# Patient Record
Sex: Female | Born: 1955 | Race: Black or African American | Hispanic: No | Smoking: Never smoker
Health system: Southern US, Community
[De-identification: ages and names within clinical notes are randomized; demographics above are authoritative.]

## PROBLEM LIST (undated history)

## (undated) DIAGNOSIS — I10 Essential (primary) hypertension: Secondary | ICD-10-CM

---

## 2013-04-15 ENCOUNTER — Ambulatory Visit: Payer: Self-pay | Admitting: Family Medicine

## 2014-01-09 ENCOUNTER — Ambulatory Visit: Payer: Self-pay | Admitting: Family Medicine

## 2015-01-26 ENCOUNTER — Ambulatory Visit
Admission: EM | Admit: 2015-01-26 | Discharge: 2015-01-26 | Disposition: A | Discharge status: Home / Self Care | Payer: BLUE CROSS/BLUE SHIELD

## 2015-01-26 ENCOUNTER — Encounter: Payer: Self-pay | Admitting: Emergency Medicine

## 2015-01-26 DIAGNOSIS — L03116 Cellulitis of left lower limb: Secondary | ICD-10-CM

## 2015-01-26 DIAGNOSIS — B029 Zoster without complications: Secondary | ICD-10-CM

## 2015-01-26 HISTORY — DX: Essential (primary) hypertension: I10

## 2015-01-26 NOTE — ED Provider Notes (Signed)
Patient presents today with itchy clustered red bumps on the back of her left calf since Monday. She denies being outdoors. She states that the rash appeared while she was at work seated. She denies any fever or chills. She denies any rash on the right side of her body. She denies any headache, sore throat, weight loss. She does admit to having shingles in the past.  ROS: Negative except mentioned above. VITALS: Per chart  GENERAL: NAD HEENT: no pharyngeal erythema, no exudate, no cervical LAD RESP: CTA B CARD: RRR SKIN: cluster of erythematous vesicular lesions with warm erythematous base on left calf, no fluid or discharge from site, no streaks NEURO: CN II-XII groslly intact   A/P: Skin rash left calf-discussed with patient that rash appears to be suggestive of shingles however patient does describe itchiness instead of pain. It also has a warm base suggestive of possible infection. We will treat with Valtrex and Bactrim DS. Encourage patient to keep the area clean and dry. If symptoms persist or worsen she is to seek medical attention as discussed. Encourage patient to keep the area covered by wearing pants and a dressing if any drainage from the site.   Jolene Provost, MD 01/26/15 [phone]

## 2015-01-26 NOTE — ED Notes (Signed)
Patient c/o itchy red bumps on the back of her left calf since Monday.  Patient denies fevers.

## 2015-01-26 NOTE — ED Notes (Signed)
Patient called and reports she went to [facility] [place] and none of her medications were there. Per chart, it looks like the medications were not sent in. Per [facility], rx called in for Bactrim DS BID for 10 days #20 no refill. Also called in Valtrex 1gram TID for 7 days #21 no refills.

## 2015-02-09 ENCOUNTER — Telehealth: Payer: Self-pay

## 2015-02-09 ENCOUNTER — Encounter: Payer: Self-pay | Admitting: *Deleted

## 2015-02-09 ENCOUNTER — Ambulatory Visit
Admission: EM | Admit: 2015-02-09 | Discharge: 2015-02-09 | Disposition: A | Discharge status: Home / Self Care | Payer: BLUE CROSS/BLUE SHIELD | Attending: Internal Medicine | Admitting: Internal Medicine

## 2015-02-09 DIAGNOSIS — B009 Herpesviral infection, unspecified: Secondary | ICD-10-CM | POA: Diagnosis not present

## 2015-02-09 MED ORDER — VALACYCLOVIR HCL 500 MG PO TABS: 500.0000 mg | ORAL_TABLET | Freq: Two times a day (BID) | ORAL | Status: DC

## 2015-02-09 MED ORDER — VALACYCLOVIR HCL 500 MG PO TABS
500.0000 mg | ORAL_TABLET | Freq: Two times a day (BID) | ORAL | Status: AC
Start: 2015-02-09 — End: 2015-02-12

## 2015-02-09 NOTE — Discharge Instructions (Signed)
Prescription for valtrex sent to the [facility] in [place].  Cold Sore A cold sore (fever blister) is a skin infection caused by the herpes simplex virus (HSV-1). HSV-1 is closely related to the virus that causes genital herpes (HSV-2), but they are not the same even though both viruses can cause oral and genital infections. Cold sores are small, fluid-filled sores inside of the mouth or on the lips, gums, nose, chin, cheeks, or fingers.  The herpes simplex virus can be easily passed (contagious) to other people through close personal contact, such as kissing or sharing personal items. The virus can also spread to other parts of the body, such as the eyes or genitals. Cold sores are contagious until the sores crust over completely. They often heal within 2 weeks.  Once a person is infected, the herpes simplex virus remains permanently in the body. Therefore, there is no cure for cold sores, and they often recur when a person is tired, stressed, sick, or gets too much sun. Additional factors that can cause a recurrence include hormone changes in menstruation or pregnancy, certain drugs, and cold weather.  CAUSES  Cold sores are caused by the herpes simplex virus. The virus is spread from person to person through close contact, such as through kissing, touching the affected area, or sharing personal items such as lip balm, razors, or eating utensils.  SYMPTOMS  The first infection may not cause symptoms. If symptoms develop, the symptoms often go through different stages. Here is how a cold sore develops:   Tingling, itching, or burning is felt 1-2 days before the outbreak.   Fluid-filled blisters appear on the lips, inside the mouth, nose, or on the cheeks.   The blisters start to ooze clear fluid.   The blisters dry up and a yellow crust appears in its place.   The crust falls off.  Symptoms depend on whether it is the initial outbreak or a recurrence. Some other symptoms with the first  outbreak may include:   Fever.   Sore throat.   Headache.   Muscle aches.   Swollen neck glands.  DIAGNOSIS  A diagnosis is often made based on your symptoms and looking at the sores. Sometimes, a sore may be swabbed and then examined in the lab to make a final diagnosis. If the sores are not present, blood tests can find the herpes simplex virus.  TREATMENT  There is no cure for cold sores and no vaccine for the herpes simplex virus. Within 2 weeks, most cold sores go away on their own without treatment. Medicines cannot make the infection go away, but medicine can help relieve some of the pain associated with the sores, can work to stop the virus from multiplying, and can also shorten healing time. Medicine may be in the form of creams, gels, pills, or a shot.  HOME CARE INSTRUCTIONS   Only take over-the-counter or prescription medicines for pain, discomfort, or fever as directed by your caregiver. Do not use aspirin.   Use a cotton-tip swab to apply creams or gels to your sores.   Do not touch the sores or pick the scabs. Wash your hands often. Do not touch your eyes without washing your hands first.   Avoid kissing, oral sex, and sharing personal items until sores heal.   Apply an ice pack on your sores for 10-15 minutes to ease any discomfort.   Avoid hot, cold, or salty foods because they may hurt your mouth. Eat a soft, bland diet  to avoid irritating the sores. Use a straw to drink if you have pain when drinking out of a glass.   Keep sores clean and dry to prevent an infection of other tissues.   Avoid the sun and limit stress if these things trigger outbreaks. If sun causes cold sores, apply sunscreen on the lips before being out in the sun.  SEEK MEDICAL CARE IF:   You have a fever or persistent symptoms for more than 2-3 days.   You have a fever and your symptoms suddenly get worse.   You have pus, not clear fluid, coming from the sores.   You have  redness that is spreading.   You have pain or irritation in your eye.   You get sores on your genitals.   Your sores do not heal within 2 weeks.   You have a weakened immune system.   You have frequent recurrences of cold sores.  MAKE SURE YOU:   Understand these instructions.  Will watch your condition.  Will get help right away if you are not doing well or get worse. Document Released: 07/27/2000 Document Revised: 12/14/2013 Document Reviewed: 12/12/2011 [facility] Patient Information 2015 [place], [place]. This information is not intended to replace advice given to you by your health care provider. Make sure you discuss any questions you have with your health care provider.

## 2015-02-09 NOTE — ED Notes (Signed)
Pt states " Diagnosed on 01/24/15 with shingles, rash was on the back of left leg.  Now the rash has returned again on Monday but is on the left thigh and bottom."

## 2015-02-09 NOTE — ED Notes (Signed)
Pt called stating she is having another shingles outbreak, this time on her left hip. She is wondering is she needs to be seen again.   Informed per Dr. Allena KatzPatel she does need to be seen again. Pt verb she will come over again. Her PCP is on vacation for the next 2 weeks.

## 2015-02-11 NOTE — ED Provider Notes (Signed)
CSN: [id number]     Arrival date & time 02/09/15  1903 History   First MD Initiated Contact with Patient 02/09/15 2021     Chief Complaint  Patient presents with  . Herpes Zoster   HPI Pt states " Diagnosed on 01/24/15 with shingles, rash was on the back of left leg. Now the rash has returned again on Monday (2d ago) but is on the left thigh and bottom." Itchy, more than sore. Had shingles on the same leg a few years ago, also.               Past Medical History  Diagnosis Date  . Hypertension    History reviewed. No pertinent past surgical history. No family history on file. History  Substance Use Topics  . Smoking status: Never Smoker   . Smokeless tobacco: Never Used  . Alcohol Use: Yes   OB History    No data available     Review of Systems  All other systems reviewed and are negative.   Allergies  Review of patient's allergies indicates no known allergies.  Home Medications   Prior to Admission medications   Medication Sig Start Date End Date Taking? Authorizing Provider  hydrochlorothiazide (HYDRODIURIL) 25 MG tablet Take 25 mg by mouth daily.   Yes Historical Provider, MD          BP 123/75 mmHg  Pulse 74  Temp(Src) 98 F (36.7 C) (Oral)  Resp 16  Ht 5\' 2"  (1.575 m)  Wt 178 lb (80.74 kg)  BMI 32.55 kg/m2  SpO2 100% Physical Exam  Constitutional: She is oriented to person, place, and time. No distress.  Alert, nicely groomed  HENT:  Head: Atraumatic.  Eyes:  Conjugate gaze, no eye redness/drainage  Neck: Neck supple.  Cardiovascular: Normal rate.   Pulmonary/Chest: No respiratory distress.  Abdominal: She exhibits no distension.  Musculoskeletal: Normal range of motion.  No leg swelling  Neurological: She is alert and oriented to person, place, and time.  Skin: Skin is warm and dry.  No cyanosis Posterior L thigh/buttock: several discrete red patches, 1.5" across, very minimally indurated, occ tiny vesicles.  Not tender.  No crusting.   Hyperpigmented patch upper L calf/back of knee from prior episode in June, per pt.  Nursing note and vitals reviewed.   ED Course  Procedures  No procedure at the urgent care today.  MDM   1. Herpes simplex infection of skin    Vs zoster.  Multiple episodes suggestive of HSV rather than VZV.  Treatment similar.   Might benefit from suppressive treatment, if continues with frequent outbreaks. Rx valtrex sent to pharmacy.  Pt to followup with pcp/Deborah Sharp-[place], to discuss further.    Eustace MooreLaura W Kelechi Orgeron, MD 02/11/15 [phone]

## 2015-09-12 IMAGING — US US EXTREM LOW VENOUS*L*
1 series · 13 of 24 positions shown · non-contrast
Comparison: None.

CLINICAL DATA: Left leg pain and swelling, evaluate for DVT



[Series 1: us extrem low venous*left* · 0.08mm/px · 13 of 30 slices shown]
[im 1/30]
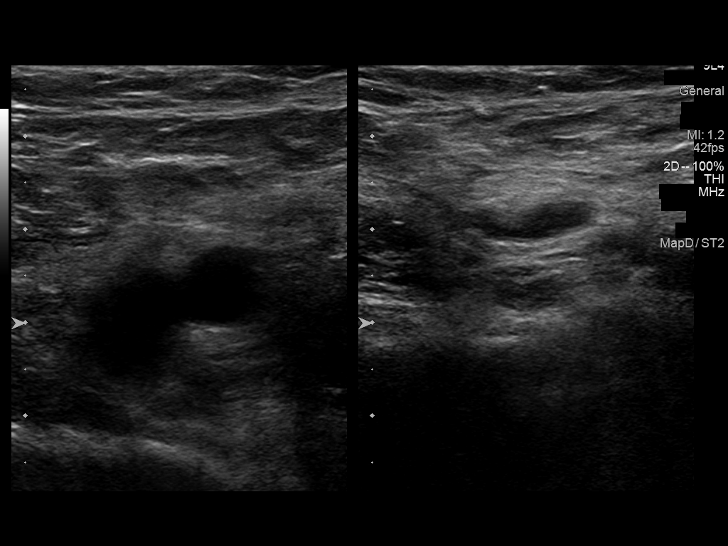
[im 3/30]
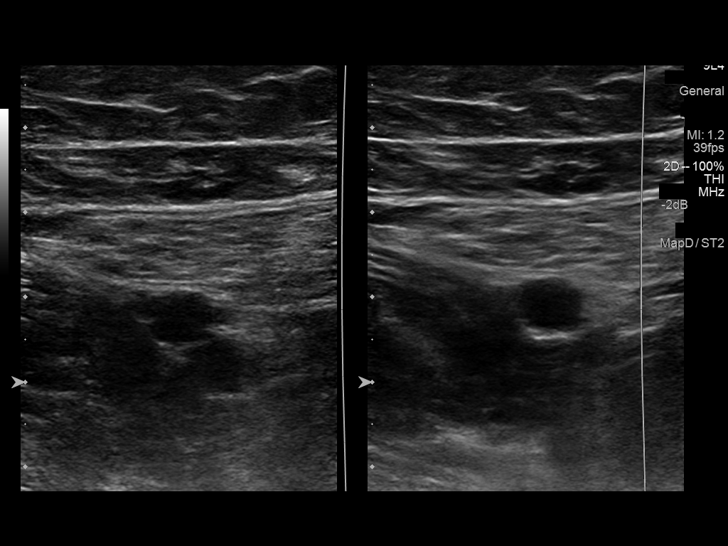
[im 6/30]
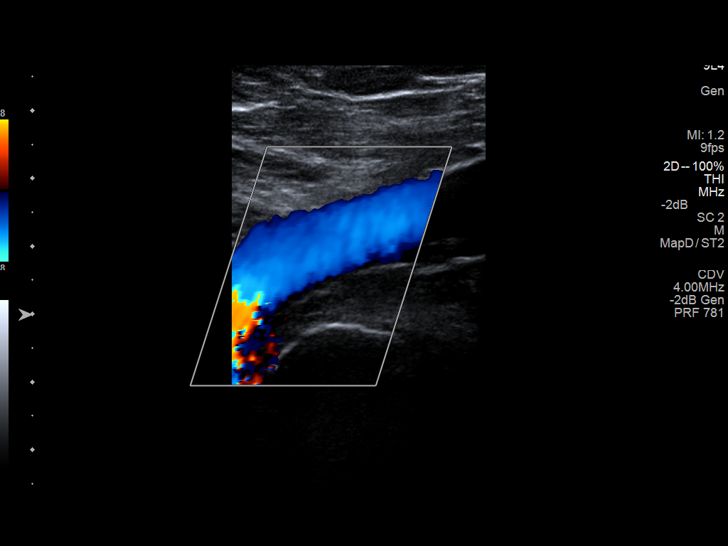
[im 8/30]
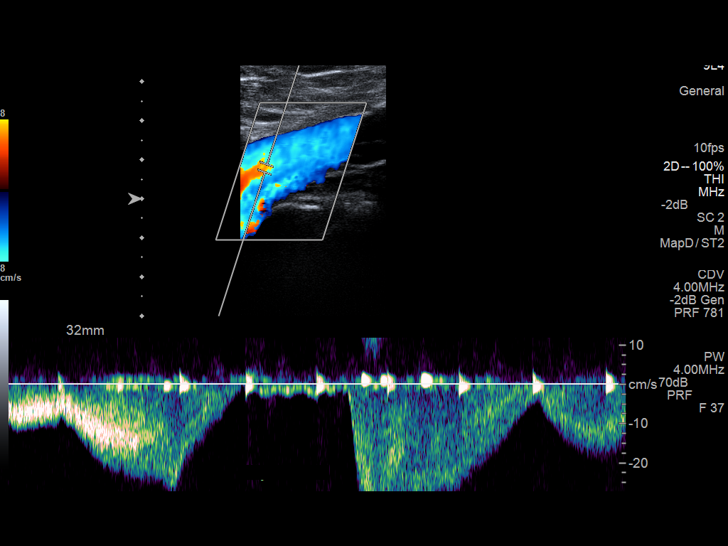
[im 11/30]
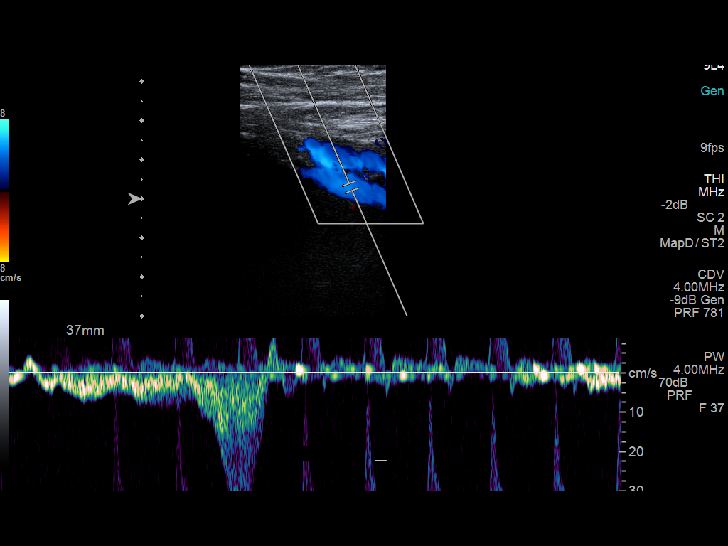
[im 13/30]
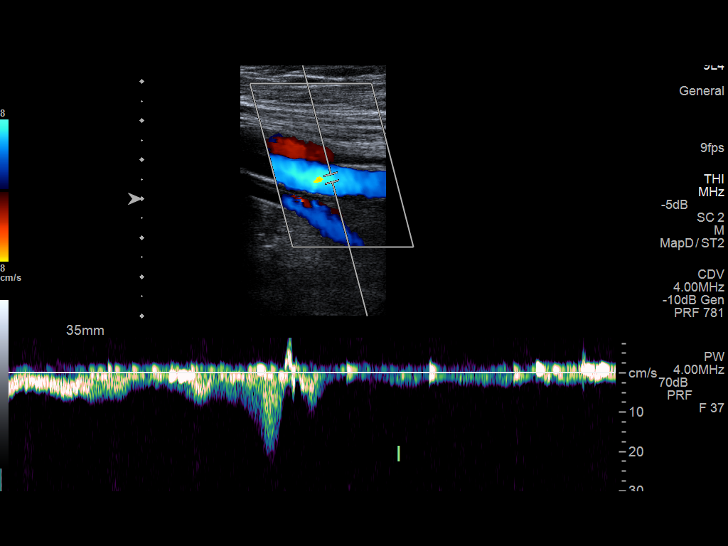
[im 16/30]
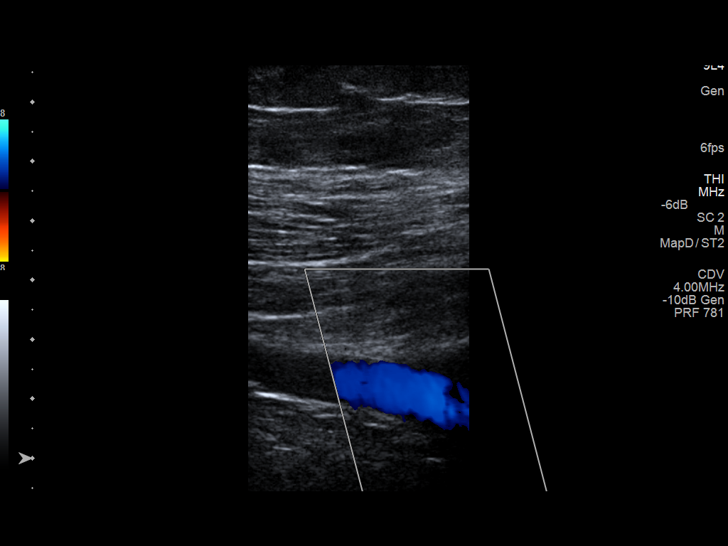
[im 17/30]
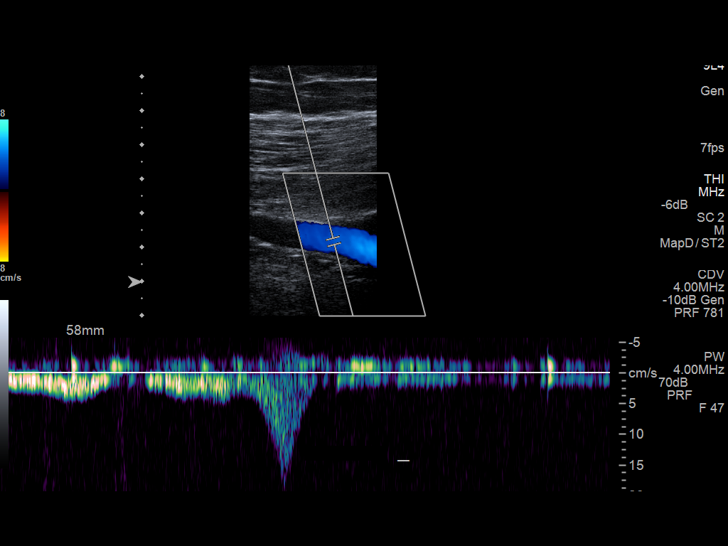
[im 19/30]
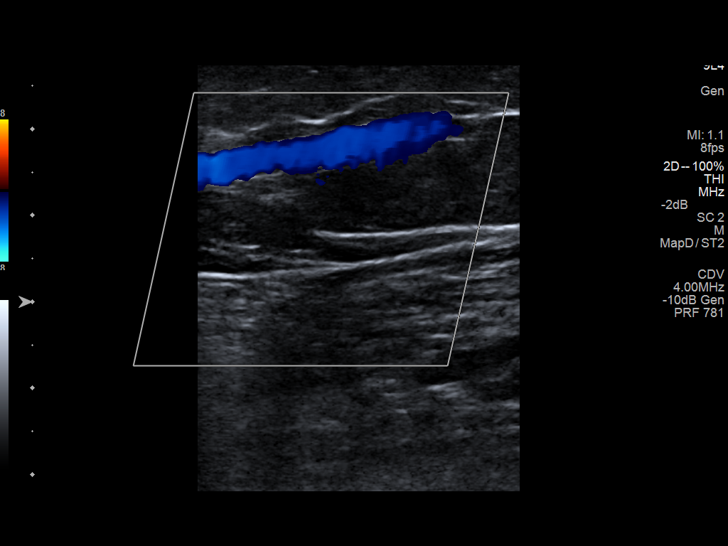
[im 22/30]
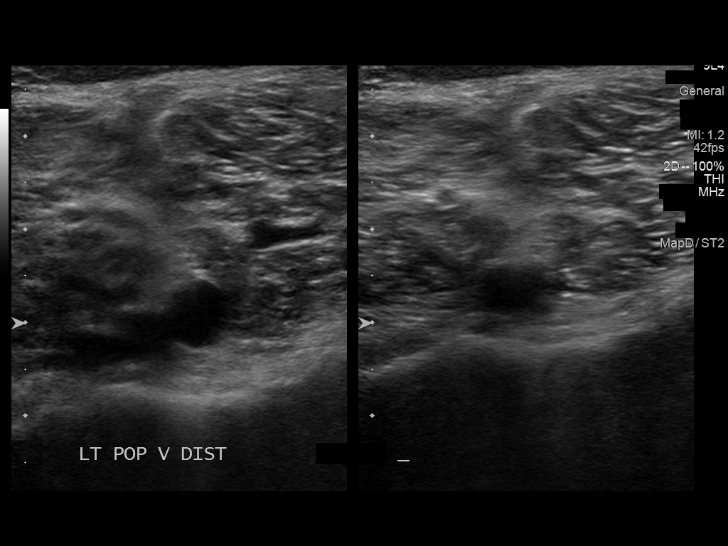
[im 24/30]
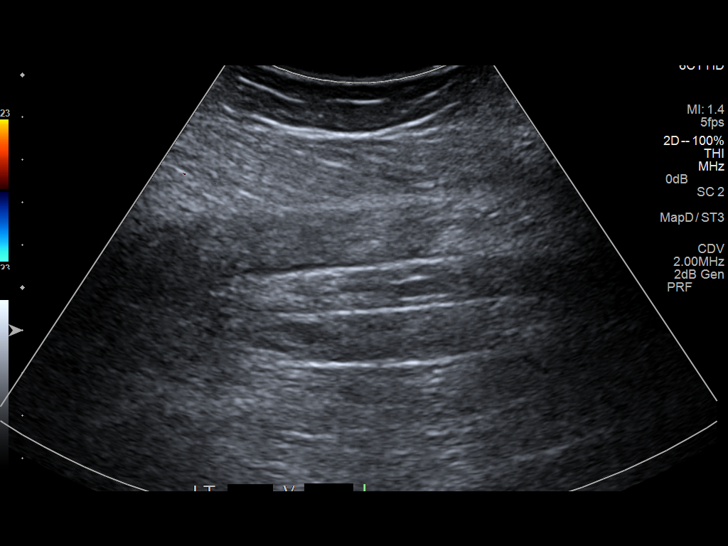
[im 27/30]
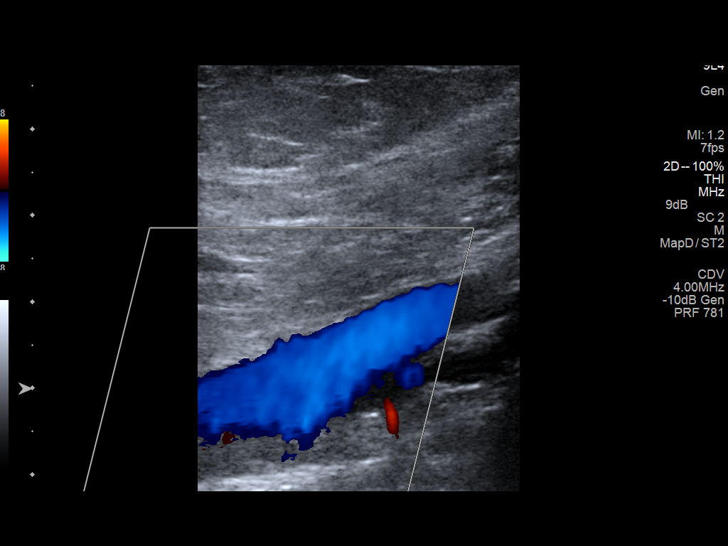
[im 30/30]
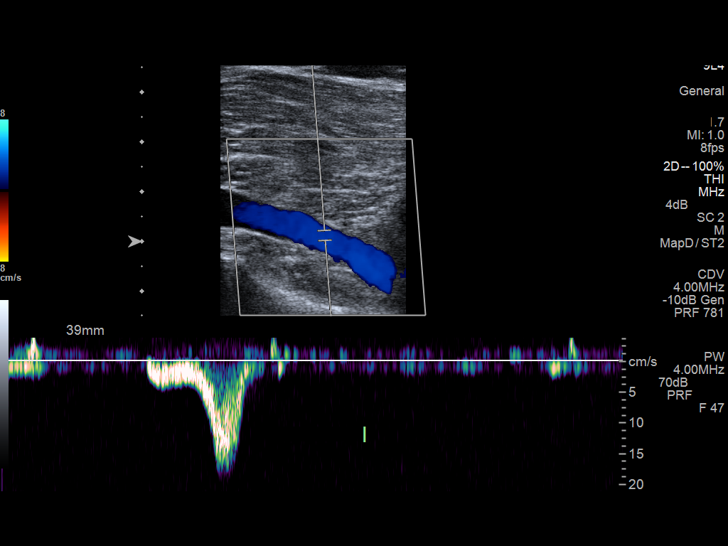

[13 of 24 positions shown; findings below may reference images not displayed]

FINDINGS: Common Femoral Vein: No evidence of thrombus. Normal
compressibility, respiratory phasicity and response to augmentation.

Saphenofemoral Junction: No evidence of thrombus. Normal
compressibility and flow on color Doppler imaging.

Profunda Femoral Vein: No evidence of thrombus. Normal
compressibility and flow on color Doppler imaging.

Femoral Vein: No evidence of thrombus. Normal compressibility,
respiratory phasicity and response to augmentation.

Popliteal Vein: No evidence of thrombus. Normal compressibility,
respiratory phasicity and response to augmentation.

Calf Veins: Not well visualized secondary to patient body habitus.

Superficial Great Saphenous Vein: No evidence of thrombus. Normal
compressibility and flow on color Doppler imaging.

Venous Reflux:  None.

Other Findings:  None.
IMPRESSION: No evidence of DVT within the left lower extremity.

## 2015-10-04 ENCOUNTER — Ambulatory Visit
Admission: EM | Admit: 2015-10-04 | Discharge: 2015-10-04 | Disposition: A | Discharge status: Home / Self Care | Payer: BLUE CROSS/BLUE SHIELD | Attending: Family Medicine | Admitting: Family Medicine

## 2015-10-04 ENCOUNTER — Encounter: Payer: Self-pay | Admitting: *Deleted

## 2015-10-04 DIAGNOSIS — J069 Acute upper respiratory infection, unspecified: Secondary | ICD-10-CM

## 2015-10-04 LAB — RAPID INFLUENZA A&B ANTIGENS (ARMC ONLY): INFLUENZA B (ARMC): NOT DETECTED

## 2015-10-04 LAB — RAPID STREP SCREEN (MED CTR MEBANE ONLY): Streptococcus, Group A Screen (Direct): NEGATIVE

## 2015-10-04 LAB — RAPID INFLUENZA A&B ANTIGENS: Influenza A (ARMC): NOT DETECTED

## 2015-10-04 MED ORDER — HYDROCOD POLST-CPM POLST ER 10-8 MG/5ML PO SUER: 5.0000 mL | Freq: Two times a day (BID) | ORAL | Status: DC

## 2015-10-04 MED ORDER — BENZONATATE 200 MG PO CAPS: 200.0000 mg | ORAL_CAPSULE | Freq: Three times a day (TID) | ORAL | Status: DC

## 2015-10-04 NOTE — Discharge Instructions (Signed)
Cool Mist Vaporizers °Vaporizers may help relieve the symptoms of a cough and cold. They add moisture to the air, which helps mucus to become thinner and less sticky. This makes it easier to breathe and cough up secretions. Cool mist vaporizers do not cause serious burns like hot mist vaporizers, which may also be called steamers or humidifiers. Vaporizers have not been proven to help with colds. You should not use a vaporizer if you are allergic to mold. °HOME CARE INSTRUCTIONS °· Follow the package instructions for the vaporizer. °· Do not use anything other than distilled water in the vaporizer. °· Do not run the vaporizer all of the time. This can cause mold or bacteria to grow in the vaporizer. °· Clean the vaporizer after each time it is used. °· Clean and dry the vaporizer well before storing it. °· Stop using the vaporizer if worsening respiratory symptoms develop. °  °This information is not intended to replace advice given to you by your health care provider. Make sure you discuss any questions you have with your health care provider. °  °Document Released: 04/26/2004 Document Revised: 08/04/2013 Document Reviewed: 12/17/2012 °Elsevier Interactive Patient Education ©2016 Elsevier Inc. ° °Cough, Adult °A cough helps to clear your throat and lungs. A cough may last only 2-3 weeks (acute), or it may last longer than 8 weeks (chronic). Many different things can cause a cough. A cough may be a sign of an illness or another medical condition. °HOME CARE °· Pay attention to any changes in your cough. °· Take medicines only as told by your doctor. °· If you were prescribed an antibiotic medicine, take it as told by your doctor. Do not stop taking it even if you start to feel better. °· Talk with your doctor before you try using a cough medicine. °· Drink enough fluid to keep your pee (urine) clear or pale yellow. °· If the air is dry, use a cold steam vaporizer or humidifier in your home. °· Stay away from things  that make you cough at work or at home. °· If your cough is worse at night, try using extra pillows to raise your head up higher while you sleep. °· Do not smoke, and try not to be around smoke. If you need help quitting, ask your doctor. °· Do not have caffeine. °· Do not drink alcohol. °· Rest as needed. °GET HELP IF: °· You have new problems (symptoms). °· You cough up yellow fluid (pus). °· Your cough does not get better after 2-3 weeks, or your cough gets worse. °· Medicine does not help your cough and you are not sleeping well. °· You have pain that gets worse or pain that is not helped with medicine. °· You have a fever. °· You are losing weight and you do not know why. °· You have night sweats. °GET HELP RIGHT AWAY IF: °· You cough up blood. °· You have trouble breathing. °· Your heartbeat is very fast. °  °This information is not intended to replace advice given to you by your health care provider. Make sure you discuss any questions you have with your health care provider. °  °Document Released: 04/12/2011 Document Revised: 04/20/2015 Document Reviewed: 10/06/2014 °Elsevier Interactive Patient Education ©2016 Elsevier Inc. ° °Upper Respiratory Infection, Adult °Most upper respiratory infections (URIs) are a viral infection of the air passages leading to the lungs. A URI affects the nose, throat, and upper air passages. The most common type of URI is nasopharyngitis and   is typically referred to as "the common cold." °URIs run their course and usually go away on their own. Most of the time, a URI does not require medical attention, but sometimes a bacterial infection in the upper airways can follow a viral infection. This is called a secondary infection. Sinus and middle ear infections are common types of secondary upper respiratory infections. °Bacterial pneumonia can also complicate a URI. A URI can worsen asthma and chronic obstructive pulmonary disease (COPD). Sometimes, these complications can require  emergency medical care and may be life threatening.  °CAUSES °Almost all URIs are caused by viruses. A virus is a type of germ and can spread from one person to another.  °RISKS FACTORS °You may be at risk for a URI if:  °· You smoke.   °· You have chronic heart or lung disease. °· You have a weakened defense (immune) system.   °· You are very young or very old.   °· You have nasal allergies or asthma. °· You work in crowded or poorly ventilated areas. °· You work in health care facilities or schools. °SIGNS AND SYMPTOMS  °Symptoms typically develop 2-3 days after you come in contact with a cold virus. Most viral URIs last 7-10 days. However, viral URIs from the influenza virus (flu virus) can last 14-18 days and are typically more severe. Symptoms may include:  °· Runny or stuffy (congested) nose.   °· Sneezing.   °· Cough.   °· Sore throat.   °· Headache.   °· Fatigue.   °· Fever.   °· Loss of appetite.   °· Pain in your forehead, behind your eyes, and over your cheekbones (sinus pain). °· Muscle aches.   °DIAGNOSIS  °Your health care provider may diagnose a URI by: °· Physical exam. °· Tests to check that your symptoms are not due to another condition such as: °¨ Strep throat. °¨ Sinusitis. °¨ Pneumonia. °¨ Asthma. °TREATMENT  °A URI goes away on its own with time. It cannot be cured with medicines, but medicines may be prescribed or recommended to relieve symptoms. Medicines may help: °· Reduce your fever. °· Reduce your cough. °· Relieve nasal congestion. °HOME CARE INSTRUCTIONS  °· Take medicines only as directed by your health care provider.   °· Gargle warm saltwater or take cough drops to comfort your throat as directed by your health care provider. °· Use a warm mist humidifier or inhale steam from a shower to increase air moisture. This may make it easier to breathe. °· Drink enough fluid to keep your urine clear or pale yellow.   °· Eat soups and other clear broths and maintain good nutrition.   °· Rest  as needed.   °· Return to work when your temperature has returned to normal or as your health care provider advises. You may need to stay home longer to avoid infecting others. You can also use a face mask and careful hand washing to prevent spread of the virus. °· Increase the usage of your inhaler if you have asthma.   °· Do not use any tobacco products, including cigarettes, chewing tobacco, or electronic cigarettes. If you need help quitting, ask your health care provider. °PREVENTION  °The best way to protect yourself from getting a cold is to practice good hygiene.  °· Avoid oral or hand contact with people with cold symptoms.   °· Wash your hands often if contact occurs.   °There is no clear evidence that vitamin C, vitamin E, echinacea, or exercise reduces the chance of developing a cold. However, it is always recommended to get plenty   of rest, exercise, and practice good nutrition.  °SEEK MEDICAL CARE IF:  °· You are getting worse rather than better.   °· Your symptoms are not controlled by medicine.   °· You have chills. °· You have worsening shortness of breath. °· You have brown or red mucus. °· You have yellow or brown nasal discharge. °· You have pain in your face, especially when you bend forward. °· You have a fever. °· You have swollen neck glands. °· You have pain while swallowing. °· You have white areas in the back of your throat. °SEEK IMMEDIATE MEDICAL CARE IF:  °· You have severe or persistent: °¨ Headache. °¨ Ear pain. °¨ Sinus pain. °¨ Chest pain. °· You have chronic lung disease and any of the following: °¨ Wheezing. °¨ Prolonged cough. °¨ Coughing up blood. °¨ A change in your usual mucus. °· You have a stiff neck. °· You have changes in your: °¨ Vision. °¨ Hearing. °¨ Thinking. °¨ Mood. °MAKE SURE YOU:  °· Understand these instructions. °· Will watch your condition. °· Will get help right away if you are not doing well or get worse. °  °This information is not intended to replace advice  given to you by your health care provider. Make sure you discuss any questions you have with your health care provider. °  °Document Released: 01/23/2001 Document Revised: 12/14/2014 Document Reviewed: 11/04/2013 °Elsevier Interactive Patient Education ©2016 Elsevier Inc. ° °

## 2015-10-04 NOTE — ED Provider Notes (Signed)
CSN: [id number]     Arrival date & time 10/04/15  4098 History   First MD Initiated Contact with Patient 10/04/15 [phone]     Chief Complaint  Patient presents with  . Sore Throat  . Fever  . Cough  . Generalized Body Aches   (Consider location/radiation/quality/duration/timing/severity/associated sxs/prior Treatment) HPI  This 60 year old female who presents with a 2 day history of the sudden onset of sore throat productive cough with green mucus and body aches. She states that the cough is annoying and keeping her awake all night. Works in a [employer] and comes in contact with sick people all the time. She is afebrile at present but states that last night she took her temperature and it was 100F. Pulse is 80 respirations 16 blood pressure 130/72 O2 sats on room air 98%.  Past Medical History  Diagnosis Date  . Hypertension    History reviewed. No pertinent past surgical history. Family History  Problem Relation Age of Onset  . Diabetes Mother   . Hypertension Mother   . Cancer Father    Social History  Substance Use Topics  . Smoking status: Never Smoker   . Smokeless tobacco: Never Used  . Alcohol Use: Yes   OB History    No data available     Review of Systems  Constitutional: Positive for activity change. Negative for fever, chills and fatigue.  HENT: Positive for congestion, postnasal drip, rhinorrhea, sinus pressure, sneezing and sore throat.   Respiratory: Positive for cough. Negative for shortness of breath, wheezing and stridor.   All other systems reviewed and are negative.   Allergies  Review of patient's allergies indicates no known allergies.  Home Medications   Prior to Admission medications   Medication Sig Start Date End Date Taking? Authorizing Provider  hydrochlorothiazide (HYDRODIURIL) 25 MG tablet Take 25 mg by mouth daily.   Yes Historical Provider, MD  benzonatate (TESSALON) 200 MG capsule Take 1 capsule (200 mg total) by mouth every 8  (eight) hours. 10/04/15   Lutricia Feil, PA-C  chlorpheniramine-HYDROcodone (TUSSIONEX PENNKINETIC ER) 10-8 MG/5ML SUER Take 5 mLs by mouth 2 (two) times daily. 10/04/15   Lutricia Feil, PA-C   Meds Ordered and Administered this Visit  Medications - No data to display  BP 130/72 mmHg  Pulse 80  Temp(Src) 98.2 F (36.8 C) (Oral)  Resp 16  Ht  (1.575 m)  Wt 183 lb (83.008 kg)  BMI 33.46 kg/m2  SpO2 98% No data found.   Physical Exam  Constitutional: She is oriented to person, place, and time. She appears well-developed and well-nourished. No distress.  HENT:  Head: Normocephalic and atraumatic.  Nose: Nose normal.  Mouth/Throat: Oropharynx is clear and moist. No oropharyngeal exudate.  Both TMs are dark with loss of light reflex bilaterally  Eyes: Conjunctivae are normal. Pupils are equal, round, and reactive to light.  Neck: Normal range of motion. Neck supple.  Pulmonary/Chest: Effort normal and breath sounds normal. No stridor. No respiratory distress. She has no wheezes. She has no rales.  Musculoskeletal: Normal range of motion. She exhibits no edema or tenderness.  Lymphadenopathy:    She has no cervical adenopathy.  Neurological: She is alert and oriented to person, place, and time.  Skin: Skin is warm and dry. She is not diaphoretic.  Psychiatric: She has a normal mood and affect. Her behavior is normal. Judgment and thought content normal.  Nursing note and vitals reviewed.   ED Course  Procedures (including critical care time)  Labs Review Labs Reviewed  RAPID STREP SCREEN (NOT AT [facility])  RAPID INFLUENZA A&B ANTIGENS ([facility] ONLY)  CULTURE, GROUP A STREP [facility])    Imaging Review No results found.   Visual Acuity Review  Right Eye Distance:   Left Eye Distance:   Bilateral Distance:    Right Eye Near:   Left Eye Near:    Bilateral Near:         MDM   1. Acute URI     Discharge Medication List as of 10/04/2015  9:44 AM    Plan: 1.  Diagnosis reviewed with patient 2. rx as per orders; risks, benefits, potential side effects reviewed with patient 3. Recommend supportive treatment with cool mist vaporizer at night. Given her prescriptions for cough suppressants and cautioned her regarding use of codeine with activities that required judgment or concentration. We'll also keep her off work today and tomorrow with return to work on Thursday. If she is worsening or is not improving she should follow-up with her primary care physician 4. F/u prn if symptoms worsen or don't improve   Lutricia Feil, PA-C 10/04/15 [phone]

## 2015-10-04 NOTE — ED Notes (Signed)
Sore throat, productive cough- green, fever, and body aches since Sunday evening.

## 2015-10-06 LAB — CULTURE, GROUP A STREP (THRC)

## 2017-01-03 ENCOUNTER — Ambulatory Visit
Admission: EM | Admit: 2017-01-03 | Discharge: 2017-01-03 | Disposition: A | Discharge status: Home / Self Care | Payer: BLUE CROSS/BLUE SHIELD | Attending: Emergency Medicine | Admitting: Emergency Medicine

## 2017-01-03 DIAGNOSIS — J069 Acute upper respiratory infection, unspecified: Secondary | ICD-10-CM | POA: Diagnosis not present

## 2017-01-03 MED ORDER — AZITHROMYCIN 250 MG PO TABS: ORAL_TABLET | ORAL | 0 refills | Status: DC

## 2017-01-03 MED ORDER — HYDROCODONE-HOMATROPINE 5-1.5 MG/5ML PO SYRP: 5.0000 mL | ORAL_SOLUTION | Freq: Every evening | ORAL | 0 refills | Status: DC | PRN

## 2017-01-03 MED ORDER — BENZONATATE 100 MG PO CAPS: 100.0000 mg | ORAL_CAPSULE | Freq: Three times a day (TID) | ORAL | 0 refills | Status: DC | PRN

## 2017-01-03 NOTE — ED Provider Notes (Signed)
CSN: [id number]     Arrival date & time 01/03/17  1809 History   First MD Initiated Contact with Patient 01/03/17 1848     Chief Complaint  Patient presents with  . URI   HPI Patient presents today with fever over the past 24 hours, however symptoms ongoing for 8 days.  Patient reports history of cough, sore throat and nasal drainage since last week.  Occasional body aches, symptoms began gradually over the past severyal days.  No vision changes, non-productive cough.  She denies any SOB or chest pain.  Had a fever of 100.1 earlier today which caused her to seek evaluation.   Past Medical History:  Diagnosis Date  . Hypertension    No past surgical history on file. Family History  Problem Relation Age of Onset  . Diabetes Mother   . Hypertension Mother   . Cancer Father    Social History  Substance Use Topics  . Smoking status: Never Smoker  . Smokeless tobacco: Never Used  . Alcohol use 1.2 oz/week    2 Glasses of wine per week     Comment: drinks red wine on weekends   OB History    No data available     Review of Systems  Constitutional: Positive for chills and fever.  HENT: Positive for congestion and rhinorrhea.   Eyes: Negative.   Respiratory: Positive for cough. Negative for shortness of breath and wheezing.   Cardiovascular: Negative.   Gastrointestinal: Negative.   Endocrine: Negative.   Genitourinary: Negative.   Musculoskeletal: Positive for myalgias.  Skin: Negative.   Allergic/Immunologic: Negative.   Neurological: Negative.   Hematological: Negative.   Psychiatric/Behavioral: Negative.     Allergies  Patient has no known allergies.  Home Medications   Prior to Admission medications   Medication Sig Start Date End Date Taking? Authorizing Provider  azithromycin (ZITHROMAX Z-PAK) 250 MG tablet Take as directed by package. 01/03/17   Anson OregonMcGhee, Johny Pitstick Lance, PA-C  benzonatate (TESSALON) 100 MG capsule Take 1 capsule (100 mg total) by mouth 3 (three) times  daily as needed for cough. 01/03/17   Anson OregonMcGhee, Willye Javier Lance, PA-C  chlorpheniramine-HYDROcodone [facility](TUSSIONEX PENNKINETIC ER) 10-8 MG/5ML SUER Take 5 mLs by mouth 2 (two) times daily. 10/04/15   Lutricia Feiloemer, William P, PA-C  hydrochlorothiazide (HYDRODIURIL) 25 MG tablet Take 25 mg by mouth daily.    [provider]  HYDROcodone-homatropine (HYCODAN) 5-1.5 MG/5ML syrup Take 5 mLs by mouth at bedtime as needed for cough. 01/03/17   Anson OregonMcGhee, Beau Vanduzer Lance, PA-C   Meds Ordered and Administered this Visit  Medications - No data to display  BP (!) 146/90 (BP Location: Left Arm)   Pulse (!) 108   Temp 100.1 F (37.8 C) (Oral)   Ht 5\' 2"  (1.575 m)   Wt 186 lb (84.4 kg)   SpO2 98%   BMI 34.02 kg/m  No data found.   Physical Exam  Constitutional: She appears well-developed and well-nourished. No distress.  HENT:  Right Ear: Hearing and tympanic membrane normal.  Left Ear: Hearing and tympanic membrane normal.  Nose: Mucosal edema present. Right sinus exhibits no maxillary sinus tenderness and no frontal sinus tenderness. Left sinus exhibits no maxillary sinus tenderness and no frontal sinus tenderness.  Mouth/Throat: Uvula is midline, oropharynx is clear and moist and mucous membranes are normal.  Moderate erythema to bilateral nasal mucousa.  Cardiovascular: Regular rhythm and normal pulses.   S1 and S1 auscultated, soft systolic murmur noted,   Pulmonary/Chest: Effort normal  and breath sounds normal. She has no wheezes. She has no rhonchi. She has no rales.  Skin: She is not diaphoretic.    Urgent Care Course     Procedures (including critical care time)  Labs Review Labs Reviewed - No data to display  Imaging Review No results found.  MDM   1. Upper respiratory tract infection, unspecified type   -  Treatment options discussed today. -  Due to symptoms present longer than 1 week, z-pak prescribed. -  Tessalon and Hycodan for cough. -  Informed patient about soft systolic murmur  auscultated, instructed her to follow-up with her PCP for further evaluation. -  Return to Urgent Care if symptoms fail to improve or worsen.    Anson Oregon, [place] 01/03/17 1910

## 2017-01-03 NOTE — ED Triage Notes (Signed)
Pt is c/o URI sx that started 1 week ago. Pt reports her sx improved, but then started again today at noon, and are worse than they were at onset. Pt is c/o fever (101.3), aching, sore throat, headache, dry cough. Pt has tried Tylenol Cold and Flu, without relief of sx today.

## 2017-01-03 NOTE — Discharge Instructions (Signed)
-  Take medications as directed. -If no improvement on antibiotics follow-up with PCP.

## 2017-11-28 ENCOUNTER — Encounter: Payer: Self-pay | Admitting: *Deleted

## 2017-11-28 ENCOUNTER — Ambulatory Visit
Admission: EM | Admit: 2017-11-28 | Discharge: 2017-11-28 | Disposition: A | Discharge status: Home / Self Care | Payer: BLUE CROSS/BLUE SHIELD | Attending: Emergency Medicine | Admitting: Emergency Medicine

## 2017-11-28 DIAGNOSIS — M791 Myalgia, unspecified site: Secondary | ICD-10-CM | POA: Diagnosis not present

## 2017-11-28 MED ORDER — IBUPROFEN 600 MG PO TABS: 600.0000 mg | ORAL_TABLET | Freq: Four times a day (QID) | ORAL | 0 refills | Status: DC | PRN

## 2017-11-28 MED ORDER — METHOCARBAMOL 750 MG PO TABS: 750.0000 mg | ORAL_TABLET | ORAL | 0 refills | Status: DC

## 2017-11-28 MED ORDER — HYDROCODONE-ACETAMINOPHEN 5-325 MG PO TABS: 1.0000 | ORAL_TABLET | Freq: Four times a day (QID) | ORAL | 0 refills | Status: DC | PRN

## 2017-11-28 NOTE — ED Provider Notes (Signed)
HPI  SUBJECTIVE:  Leah Hunter is a 62 y.o. female who presents with daily, intermittent 30 minutes long posterior leg pain radiating up into her buttocks for the past 2 weeks.  she describes the pain as throbbing, pulling, stretching.  She denies any anterior leg pain.  No lumbar, knee, calf pain.  No calf swelling.  No change in her physical activity although she does do a stair stepper every night.  She states that she has been doing this for a long time and it has not changed in intensity or duration.  She has not been doing this activity since her pain started although she has been walking every day.  No trauma to her legs.  No shoulder, arm pain, other arthralgias.  She reports intermittent left foot numbness with walking and in the morning.  No fevers, unintentional weight loss, rash.  She has had symptoms like this before when she had shingles.  She denies dark or tea colored urine, hematuria.  She tried alternating Advil 400 mg 3 times daily to 4 times daily, Tylenol thousand milligrams, and unknown pain ointment and walking.  Advil, Tylenol, walking helps.  Symptoms are worse with sitting for prolonged periods of time.  It does not seem to be associated with bending forward or squatting.  She has a past medical history of shingles along her left buttock/low back, hypertension.  No history of hypercholesterolemia, statins, diabetes, PE, DVT, cancer, kidney disease.  [id number], Vernell Leep, PA   Past Medical History:  Diagnosis Date  . Hypertension     History reviewed. No pertinent surgical history.  Family History  Problem Relation Age of Onset  . Diabetes Mother   . Hypertension Mother   . Cancer Father     Social History   Tobacco Use  . Smoking status: Never Smoker  . Smokeless tobacco: Never Used  Substance Use Topics  . Alcohol use: Yes    Alcohol/week: 1.2 oz    Types: 2 Glasses of wine per week    Comment: drinks red wine on weekends  . Drug use: No    No  current facility-administered medications for this encounter.   Current Outpatient Medications:  .  hydrochlorothiazide (HYDRODIURIL) 25 MG tablet, Take 25 mg by mouth daily., Disp: , Rfl:  .  HYDROcodone-acetaminophen (NORCO/VICODIN) 5-325 MG tablet, Take 1-2 tablets by mouth every 6 (six) hours as needed for moderate pain or severe pain., Disp: 20 tablet, Rfl: 0 .  ibuprofen (ADVIL,MOTRIN) 600 MG tablet, Take 1 tablet (600 mg total) by mouth every 6 (six) hours as needed., Disp: 30 tablet, Rfl: 0 .  methocarbamol (ROBAXIN) 750 MG tablet, Take 1 tablet (750 mg total) by mouth every 4 (four) hours., Disp: 40 tablet, Rfl: 0  No Known Allergies   ROS  As noted in HPI.   Physical Exam  BP 135/81 (BP Location: Left Arm)   Pulse (!) 58   Temp 97.8 F (36.6 C) (Oral)   Resp 16   Ht 5\' 1"  (1.549 m)   Wt 182 lb (82.6 kg)   SpO2 100%   BMI 34.39 kg/m   Constitutional: Well developed, well nourished, no acute distress Eyes:  EOMI, conjunctiva normal bilaterally HENT: Normocephalic, atraumatic,mucus membranes moist Respiratory: Normal inspiratory effort Cardiovascular: Normal rate GI: nondistended skin: No rash, bruising over buttocks or legs, skin intact Musculoskeletal: no deformities. Positive tenderness bilateral biceps femoris over the gluteus maximus..  No tenderness anteriorly or over the IT band.  Positive tenderness bilateral  sciatic notches.  No SI joint, L-spine tenderness.  No paralumbar tenderness.  No pain with flexion/ extension at the knees.  No pain with passive hip range of motion in all directions, SLR negative.  Sensation grossly intact over bilateral lower extremities.  PT 2+.  Calves symmetric, nontender, no edema.  Pain aggravated with bending forward. Neurologic: Alert & oriented x 3, no focal neuro deficits Psychiatric: Speech and behavior appropriate   ED Course   Medications - No data to display  No orders of the defined types were placed in this  encounter.   No results found for this or any previous visit (from the past 24 hour(s)). No results found.  ED Clinical Impression  Myalgia   ED Assessment/Plan  [place] Narcotic database reviewed for this patient, and feel that the risk/benefit ratio today is favorable for proceeding with a prescription for controlled substance.  No opiate prescriptions in 2 years  Patient is not on any statins.  Doubt rhabdomyolysis because she has not exercised in 2 weeks other than walking.  No color in the changes of her urine or urinary complaints.  No pain over the lumbar spine or with hip movement, so doubt osteoarthritis as the cause.  There is no evidence of shingles.  Doubt other muscular dystrophy as she has no other arthralgias or myalgias.  DVT.  Plan to send home with ibuprofen 600 mg 1 g of Tylenol 3-4 times a day as needed for pain, ibuprofen 600 mg to take with Norco for severe pain.  Discussed with patient to not take Tylenol and Norco. Also Robaxin, deep tissue massage.  Offered to do labs today including a CK, UA, BMP, and she decided to try the prescription medication first.  If this does not work she will return here in several days and we can do labs at that point in time.  Or she can follow-up with her primary care physician for further workup..   Discussed MDM, treatment plan, and plan for follow-up with patient. patient agrees with plan.   Meds ordered this encounter  Medications  . ibuprofen (ADVIL,MOTRIN) 600 MG tablet    Sig: Take 1 tablet (600 mg total) by mouth every 6 (six) hours as needed.    Dispense:  30 tablet    Refill:  0  . methocarbamol (ROBAXIN) 750 MG tablet    Sig: Take 1 tablet (750 mg total) by mouth every 4 (four) hours.    Dispense:  40 tablet    Refill:  0  . HYDROcodone-acetaminophen (NORCO/VICODIN) 5-325 MG tablet    Sig: Take 1-2 tablets by mouth every 6 (six) hours as needed for moderate pain or severe pain.    Dispense:  20 tablet    Refill:  0     *This clinic note was created using Scientist, clinical (histocompatibility and immunogenetics)Dragon dictation software. Therefore, there may be occasional mistakes despite careful proofreading.   ?   Domenick GongMortenson, Kayson Bullis, MD 11/28/17 1654

## 2017-11-28 NOTE — ED Triage Notes (Signed)
Thigh and hip pain x2 weeks, both sides. Denies injury.

## 2017-11-28 NOTE — Discharge Instructions (Addendum)
Try ibuprofen 600 mg with 1 g of Tylenol 3-4 times a day as needed for pain. take ibuprofen 600 mg with 1-2 Norco for severe pain, do not take Tylenol and Norco as we discussed do not take any Tylenol containing product more than 4 times a day. Also try the Robaxin which is a muscle relaxant,  deep tissue massage.

## 2018-10-12 ENCOUNTER — Ambulatory Visit
Admission: EM | Admit: 2018-10-12 | Discharge: 2018-10-12 | Disposition: A | Discharge status: Home / Self Care | Payer: BLUE CROSS/BLUE SHIELD | Attending: Family Medicine | Admitting: Family Medicine

## 2018-10-12 ENCOUNTER — Encounter: Payer: Self-pay | Admitting: Emergency Medicine

## 2018-10-12 ENCOUNTER — Other Ambulatory Visit: Payer: Self-pay

## 2018-10-12 DIAGNOSIS — M5432 Sciatica, left side: Secondary | ICD-10-CM | POA: Diagnosis not present

## 2018-10-12 MED ORDER — CYCLOBENZAPRINE HCL 10 MG PO TABS: 10.0000 mg | ORAL_TABLET | Freq: Three times a day (TID) | ORAL | 0 refills | Status: DC | PRN

## 2018-10-12 MED ORDER — PREDNISONE 20 MG PO TABS: 40.0000 mg | ORAL_TABLET | Freq: Every day | ORAL | 0 refills | Status: DC

## 2018-10-12 NOTE — Discharge Instructions (Signed)
Over the counter extra strength tylenol three times daily

## 2018-10-12 NOTE — ED Provider Notes (Signed)
[facility]    CSN: [id number] Arrival date & time: 10/12/18  1239     History   Chief Complaint Chief Complaint  Patient presents with  . Back Pain    HPI Leah Hunter is a 63 y.o. female.   The history is provided by the patient.  Back Pain  Location:  Lumbar spine Quality:  Aching Radiates to:  L posterior upper leg Pain severity:  Moderate Onset quality:  Sudden Duration:  3 days Timing:  Constant Progression:  Unchanged Chronicity:  New Context: not emotional stress, not falling, not jumping from heights, not lifting heavy objects, not MCA, not MVA, not occupational injury, not pedestrian accident, not physical stress, not recent illness, not recent injury and not twisting   Relieved by:  None tried Ineffective treatments:  None tried Associated symptoms: no abdominal pain, no abdominal swelling, no bladder incontinence, no bowel incontinence, no chest pain, no dysuria, no fever, no headaches, no leg pain, no numbness, no paresthesias, no pelvic pain, no perianal numbness, no tingling, no weakness and no weight loss     Past Medical History:  Diagnosis Date  . Hypertension     There are no active problems to display for this patient.   Past Surgical History:  Procedure Laterality Date  . CESAREAN SECTION      OB History   No obstetric history on file.      Home Medications    Prior to Admission medications   Medication Sig Start Date End Date Taking? Authorizing Provider  hydrochlorothiazide (HYDRODIURIL) 25 MG tablet Take 25 mg by mouth daily.   Yes [provider]  cyclobenzaprine (FLEXERIL) 10 MG tablet Take 1 tablet (10 mg total) by mouth 3 (three) times daily as needed for muscle spasms. 10/12/18   Payton Mccallum, MD  HYDROcodone-acetaminophen (NORCO/VICODIN) 5-325 MG tablet Take 1-2 tablets by mouth every 6 (six) hours as needed for moderate pain or severe pain. 11/28/17   Domenick Gong, MD  ibuprofen (ADVIL,MOTRIN) 600  MG tablet Take 1 tablet (600 mg total) by mouth every 6 (six) hours as needed. 11/28/17   Domenick Gong, MD  methocarbamol (ROBAXIN) 750 MG tablet Take 1 tablet (750 mg total) by mouth every 4 (four) hours. 11/28/17   Domenick Gong, MD  predniSONE (DELTASONE) 20 MG tablet Take 2 tablets (40 mg total) by mouth daily. 10/12/18   Payton Mccallum, MD    Family History Family History  Problem Relation Age of Onset  . Diabetes Mother   . Hypertension Mother   . Cancer Father     Social History Social History   Tobacco Use  . Smoking status: Never Smoker  . Smokeless tobacco: Never Used  Substance Use Topics  . Alcohol use: Yes    Alcohol/week: 2.0 standard drinks    Types: 2 Glasses of wine per week    Comment: drinks red wine on weekends  . Drug use: No     Allergies   Patient has no known allergies.   Review of Systems Review of Systems  Constitutional: Negative for fever and weight loss.  Cardiovascular: Negative for chest pain.  Gastrointestinal: Negative for abdominal pain and bowel incontinence.  Genitourinary: Negative for bladder incontinence, dysuria and pelvic pain.  Musculoskeletal: Positive for back pain.  Neurological: Negative for tingling, weakness, numbness, headaches and paresthesias.     Physical Exam Triage Vital Signs ED Triage Vitals  Enc Vitals Group     BP 10/12/18 1310 (!) 142/75  Pulse Rate 10/12/18 1310 65     Resp 10/12/18 1310 14     Temp 10/12/18 1310 98.1 F (36.7 C)     Temp Source 10/12/18 1310 Oral     SpO2 10/12/18 1310 100 %     Weight 10/12/18 1307 180 lb (81.6 kg)     Height 10/12/18 1307 5\' 1"  (1.549 m)     Head Circumference --      Peak Flow --      Pain Score 10/12/18 1307 8     Pain Loc --      Pain Edu? --      Excl. in GC? --    No data found.  Updated Vital Signs BP (!) 142/75 (BP Location: Left Arm)   Pulse 65   Temp 98.1 F (36.7 C) (Oral)   Resp 14   Ht 5\' 1"  (1.549 m)   Wt 81.6 kg   SpO2 100%    BMI 34.01 kg/m   Visual Acuity Right Eye Distance:   Left Eye Distance:   Bilateral Distance:    Right Eye Near:   Left Eye Near:    Bilateral Near:     Physical Exam Vitals signs and nursing note reviewed.  Constitutional:      General: She is not in acute distress.    Appearance: She is well-developed. She is not diaphoretic.  Musculoskeletal:        General: Tenderness present.     Lumbar back: She exhibits tenderness and spasm. She exhibits normal range of motion, no bony tenderness, no swelling, no edema, no deformity, no laceration, no pain and normal pulse.  Skin:    General: Skin is warm and dry.     Findings: No erythema or rash.  Neurological:     Mental Status: She is alert.     Motor: No abnormal muscle tone.     Deep Tendon Reflexes: Reflexes are normal and symmetric. Reflexes normal.      UC Treatments / Results  Labs (all labs ordered are listed, but only abnormal results are displayed) Labs Reviewed - No data to display  EKG None  Radiology No results found.  Procedures Procedures (including critical care time)  Medications Ordered in UC Medications - No data to display  Initial Impression / Assessment and Plan / UC Course  I have reviewed the triage vital signs and the nursing notes.  Pertinent labs & imaging results that were available during my care of the patient were reviewed by me and considered in my medical decision making (see chart for details).      Final Clinical Impressions(s) / UC Diagnoses   Final diagnoses:  Sciatica of left side    ED Prescriptions    Medication Sig Dispense Auth. Provider   cyclobenzaprine (FLEXERIL) 10 MG tablet Take 1 tablet (10 mg total) by mouth 3 (three) times daily as needed for muscle spasms. 30 tablet Payton Mccallum, MD   predniSONE (DELTASONE) 20 MG tablet Take 2 tablets (40 mg total) by mouth daily. 10 tablet Payton Mccallum, MD      1. diagnosis reviewed with patient 2. rx as per orders  above; reviewed possible side effects, interactions, risks and benefits  3. Follow-up prn if symptoms worsen or don't improve   Controlled Substance Prescriptions [place] Controlled Substance Registry consulted? Not Applicable   Payton Mccallum, MD 10/12/18 [phone]

## 2018-10-12 NOTE — ED Triage Notes (Signed)
Patient c/o lower back pain that started on Friday.  Patient denies injury or fall.

## 2021-01-18 ENCOUNTER — Ambulatory Visit
Admission: EM | Admit: 2021-01-18 | Discharge: 2021-01-18 | Disposition: A | Discharge status: Home / Self Care | Payer: BC Managed Care – PPO | Attending: Internal Medicine | Admitting: Internal Medicine

## 2021-01-18 ENCOUNTER — Other Ambulatory Visit: Payer: Self-pay

## 2021-01-18 DIAGNOSIS — R202 Paresthesia of skin: Secondary | ICD-10-CM

## 2021-01-18 MED ORDER — PREDNISONE 10 MG PO TABS: 20.0000 mg | ORAL_TABLET | Freq: Every day | ORAL | 0 refills | Status: AC

## 2021-01-18 NOTE — ED Triage Notes (Signed)
Patient states that she has been having bilateral hand numbness x 2 weeks with worsening pain last night.

## 2021-01-18 NOTE — ED Provider Notes (Signed)
[facility]    CSN: [id number] Arrival date & time: 01/18/21  1650      History   Chief Complaint Chief Complaint  Patient presents with  . Numbness    HPI Leah Hunter is a 65 y.o. female who presents with bilateral hand numbness x 2 weeks and got worse last night. Denies dx of cervical DD, denies doing repetitive work with her hands. Has some L neck aches.      Past Medical History:  Diagnosis Date  . Hypertension     There are no problems to display for this patient.   Past Surgical History:  Procedure Laterality Date  . CESAREAN SECTION      OB History   No obstetric history on file.      Home Medications    Prior to Admission medications   Medication Sig Start Date End Date Taking? Authorizing Provider  hydrochlorothiazide (HYDRODIURIL) 25 MG tablet Take 25 mg by mouth daily.   Yes [provider]  cyclobenzaprine (FLEXERIL) 10 MG tablet Take 1 tablet (10 mg total) by mouth 3 (three) times daily as needed for muscle spasms. 10/12/18   Payton Mccallum, MD  HYDROcodone-acetaminophen (NORCO/VICODIN) 5-325 MG tablet Take 1-2 tablets by mouth every 6 (six) hours as needed for moderate pain or severe pain. 11/28/17   Domenick Gong, MD  ibuprofen (ADVIL,MOTRIN) 600 MG tablet Take 1 tablet (600 mg total) by mouth every 6 (six) hours as needed. 11/28/17   Domenick Gong, MD  methocarbamol (ROBAXIN) 750 MG tablet Take 1 tablet (750 mg total) by mouth every 4 (four) hours. 11/28/17   Domenick Gong, MD  predniSONE (DELTASONE) 20 MG tablet Take 2 tablets (40 mg total) by mouth daily. 10/12/18   Payton Mccallum, MD    Family History Family History  Problem Relation Age of Onset  . Diabetes Mother   . Hypertension Mother   . Cancer Father     Social History Social History   Tobacco Use  . Smoking status: Never Smoker  . Smokeless tobacco: Never Used  Vaping Use  . Vaping Use: Never used  Substance Use Topics  . Alcohol use: Yes     Alcohol/week: 2.0 standard drinks    Types: 2 Glasses of wine per week    Comment: drinks red wine on weekends  . Drug use: No     Allergies   Patient has no known allergies.   Review of Systems Review of Systems + paresthesia both hands, aches of forearms, mild L neck ache. The rest is neg.   Physical Exam Triage Vital Signs ED Triage Vitals  Enc Vitals Group     BP 01/18/21 1714 136/76     Pulse Rate 01/18/21 1714 65     Resp 01/18/21 1714 17     Temp 01/18/21 1714 98.1 F (36.7 C)     Temp Source 01/18/21 1714 Oral     SpO2 01/18/21 1714 100 %     Weight 01/18/21 1713 182 lb (82.6 kg)     Height 01/18/21 1713 5\' 1"  (1.549 m)     Head Circumference --      Peak Flow --      Pain Score 01/18/21 1712 8     Pain Loc --      Pain Edu? --      Excl. in GC? --    No data found.  Updated Vital Signs BP 136/76 (BP Location: Right Arm)   Pulse 65  Temp 98.1 F (36.7 C) (Oral)   Resp 17   Ht 5\' 1"  (1.549 m)   Wt 182 lb (82.6 kg)   SpO2 100%   BMI 34.39 kg/m   Visual Acuity Right Eye Distance:   Left Eye Distance:   Bilateral Distance:    Right Eye Near:   Left Eye Near:    Bilateral Near:     Physical Exam Constitutional:      Appearance: Normal appearance.  HENT:     Head: Normocephalic.     Right Ear: External ear normal.     Left Ear: External ear normal.  Eyes:     General: No scleral icterus.    Conjunctiva/sclera: Conjunctivae normal.  Neck:     Comments: L rhomboid is tender Cardiovascular:     Rate and Rhythm: Normal rate and regular rhythm.     Pulses: Normal pulses.     Heart sounds: No murmur heard.   Pulmonary:     Effort: Pulmonary effort is normal.  Musculoskeletal:        General: No swelling or tenderness. Normal range of motion.     Cervical back: Neck supple.  Skin:    General: Skin is warm and dry.     Findings: No rash.  Neurological:     Mental Status: She is alert and oriented to person, place, and time.     Motor:  No weakness.     Gait: Gait normal.     Deep Tendon Reflexes: Reflexes normal.     Comments: No facial asymmetry noted  Psychiatric:        Mood and Affect: Mood normal.        Thought Content: Thought content normal.        Judgment: Judgment normal.      UC Treatments / Results  Labs (all labs ordered are listed, but only abnormal results are displayed) Labs Reviewed - No data to display  EKG   Radiology No results found.  Procedures Procedures (including critical care time)  Medications Ordered in UC Medications - No data to display  Initial Impression / Assessment and Plan / UC Course  I have reviewed the triage vital signs and the nursing notes. Bilateral hand paresthesia with normal neuro exam. I will have her try Prednisone as noted. Told she needs to Fu with neurologist if it persists.  Final Clinical Impressions(s) / UC Diagnoses   Final diagnoses:  None   Discharge Instructions   None    ED Prescriptions    None     PDMP not reviewed this encounter.   , Garey Ham 01/18/21 1816

## 2021-01-18 NOTE — Discharge Instructions (Addendum)
Follow with a neurologist if the prednisone does not help If you get worse please go to the ER
